# Patient Record
Sex: Female | Born: 1985
Health system: Southern US, Community
[De-identification: ages and names within clinical notes are randomized; demographics above are authoritative.]

## PROBLEM LIST (undated history)

## (undated) DIAGNOSIS — E079 Disorder of thyroid, unspecified: Secondary | ICD-10-CM

---
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
## 2005-09-11 ENCOUNTER — Emergency Department (HOSPITAL_COMMUNITY): Admission: EM | Admit: 2005-09-11 | Discharge: 2005-09-11 | Payer: Self-pay | Admitting: Emergency Medicine

## 2016-03-01 DIAGNOSIS — M9901 Segmental and somatic dysfunction of cervical region: Secondary | ICD-10-CM | POA: Diagnosis not present

## 2016-03-01 DIAGNOSIS — M545 Low back pain: Secondary | ICD-10-CM | POA: Diagnosis not present

## 2016-03-01 DIAGNOSIS — M5413 Radiculopathy, cervicothoracic region: Secondary | ICD-10-CM | POA: Diagnosis not present

## 2016-03-01 DIAGNOSIS — M9903 Segmental and somatic dysfunction of lumbar region: Secondary | ICD-10-CM | POA: Diagnosis not present

## 2016-03-08 DIAGNOSIS — M9905 Segmental and somatic dysfunction of pelvic region: Secondary | ICD-10-CM | POA: Diagnosis not present

## 2016-03-08 DIAGNOSIS — M9901 Segmental and somatic dysfunction of cervical region: Secondary | ICD-10-CM | POA: Diagnosis not present

## 2016-03-08 DIAGNOSIS — M545 Low back pain: Secondary | ICD-10-CM | POA: Diagnosis not present

## 2016-03-08 DIAGNOSIS — M9903 Segmental and somatic dysfunction of lumbar region: Secondary | ICD-10-CM | POA: Diagnosis not present

## 2016-03-15 DIAGNOSIS — M9903 Segmental and somatic dysfunction of lumbar region: Secondary | ICD-10-CM | POA: Diagnosis not present

## 2016-03-15 DIAGNOSIS — M9901 Segmental and somatic dysfunction of cervical region: Secondary | ICD-10-CM | POA: Diagnosis not present

## 2016-03-15 DIAGNOSIS — M545 Low back pain: Secondary | ICD-10-CM | POA: Diagnosis not present

## 2016-03-15 DIAGNOSIS — M9905 Segmental and somatic dysfunction of pelvic region: Secondary | ICD-10-CM | POA: Diagnosis not present

## 2016-03-22 DIAGNOSIS — M9901 Segmental and somatic dysfunction of cervical region: Secondary | ICD-10-CM | POA: Diagnosis not present

## 2016-03-22 DIAGNOSIS — M545 Low back pain: Secondary | ICD-10-CM | POA: Diagnosis not present

## 2016-03-22 DIAGNOSIS — M9903 Segmental and somatic dysfunction of lumbar region: Secondary | ICD-10-CM | POA: Diagnosis not present

## 2016-03-22 DIAGNOSIS — M9905 Segmental and somatic dysfunction of pelvic region: Secondary | ICD-10-CM | POA: Diagnosis not present

## 2016-03-29 DIAGNOSIS — M9903 Segmental and somatic dysfunction of lumbar region: Secondary | ICD-10-CM | POA: Diagnosis not present

## 2016-03-29 DIAGNOSIS — M545 Low back pain: Secondary | ICD-10-CM | POA: Diagnosis not present

## 2016-03-29 DIAGNOSIS — M9905 Segmental and somatic dysfunction of pelvic region: Secondary | ICD-10-CM | POA: Diagnosis not present

## 2016-03-29 DIAGNOSIS — M9901 Segmental and somatic dysfunction of cervical region: Secondary | ICD-10-CM | POA: Diagnosis not present

## 2016-04-05 DIAGNOSIS — M545 Low back pain: Secondary | ICD-10-CM | POA: Diagnosis not present

## 2016-04-05 DIAGNOSIS — M9901 Segmental and somatic dysfunction of cervical region: Secondary | ICD-10-CM | POA: Diagnosis not present

## 2016-04-05 DIAGNOSIS — M9905 Segmental and somatic dysfunction of pelvic region: Secondary | ICD-10-CM | POA: Diagnosis not present

## 2016-04-05 DIAGNOSIS — M9903 Segmental and somatic dysfunction of lumbar region: Secondary | ICD-10-CM | POA: Diagnosis not present

## 2016-04-19 DIAGNOSIS — M9901 Segmental and somatic dysfunction of cervical region: Secondary | ICD-10-CM | POA: Diagnosis not present

## 2016-04-19 DIAGNOSIS — M9903 Segmental and somatic dysfunction of lumbar region: Secondary | ICD-10-CM | POA: Diagnosis not present

## 2016-04-19 DIAGNOSIS — M545 Low back pain: Secondary | ICD-10-CM | POA: Diagnosis not present

## 2016-04-19 DIAGNOSIS — M9905 Segmental and somatic dysfunction of pelvic region: Secondary | ICD-10-CM | POA: Diagnosis not present

## 2016-05-28 DIAGNOSIS — H66003 Acute suppurative otitis media without spontaneous rupture of ear drum, bilateral: Secondary | ICD-10-CM | POA: Diagnosis not present

## 2016-07-12 DIAGNOSIS — M9903 Segmental and somatic dysfunction of lumbar region: Secondary | ICD-10-CM | POA: Diagnosis not present

## 2016-07-12 DIAGNOSIS — M545 Low back pain: Secondary | ICD-10-CM | POA: Diagnosis not present

## 2016-07-12 DIAGNOSIS — M9901 Segmental and somatic dysfunction of cervical region: Secondary | ICD-10-CM | POA: Diagnosis not present

## 2016-07-12 DIAGNOSIS — M9905 Segmental and somatic dysfunction of pelvic region: Secondary | ICD-10-CM | POA: Diagnosis not present

## 2016-08-29 DIAGNOSIS — Z793 Long term (current) use of hormonal contraceptives: Secondary | ICD-10-CM | POA: Diagnosis not present

## 2016-08-29 DIAGNOSIS — Z130 Encounter for screening for diseases of the blood and blood-forming organs and certain disorders involving the immune mechanism: Secondary | ICD-10-CM | POA: Diagnosis not present

## 2016-08-29 DIAGNOSIS — Z01419 Encounter for gynecological examination (general) (routine) without abnormal findings: Secondary | ICD-10-CM | POA: Diagnosis not present

## 2016-08-29 DIAGNOSIS — Z1389 Encounter for screening for other disorder: Secondary | ICD-10-CM | POA: Diagnosis not present

## 2016-08-29 DIAGNOSIS — Z6831 Body mass index (BMI) 31.0-31.9, adult: Secondary | ICD-10-CM | POA: Diagnosis not present

## 2016-10-04 DIAGNOSIS — M9903 Segmental and somatic dysfunction of lumbar region: Secondary | ICD-10-CM | POA: Diagnosis not present

## 2016-10-04 DIAGNOSIS — M546 Pain in thoracic spine: Secondary | ICD-10-CM | POA: Diagnosis not present

## 2016-10-04 DIAGNOSIS — M545 Low back pain: Secondary | ICD-10-CM | POA: Diagnosis not present

## 2016-10-04 DIAGNOSIS — M9902 Segmental and somatic dysfunction of thoracic region: Secondary | ICD-10-CM | POA: Diagnosis not present

## 2016-12-20 DIAGNOSIS — M5413 Radiculopathy, cervicothoracic region: Secondary | ICD-10-CM | POA: Diagnosis not present

## 2016-12-20 DIAGNOSIS — M9903 Segmental and somatic dysfunction of lumbar region: Secondary | ICD-10-CM | POA: Diagnosis not present

## 2016-12-20 DIAGNOSIS — M545 Low back pain: Secondary | ICD-10-CM | POA: Diagnosis not present

## 2016-12-20 DIAGNOSIS — M9901 Segmental and somatic dysfunction of cervical region: Secondary | ICD-10-CM | POA: Diagnosis not present

## 2017-01-10 DIAGNOSIS — M9905 Segmental and somatic dysfunction of pelvic region: Secondary | ICD-10-CM | POA: Diagnosis not present

## 2017-01-10 DIAGNOSIS — M545 Low back pain: Secondary | ICD-10-CM | POA: Diagnosis not present

## 2017-01-10 DIAGNOSIS — M9903 Segmental and somatic dysfunction of lumbar region: Secondary | ICD-10-CM | POA: Diagnosis not present

## 2017-01-10 DIAGNOSIS — M9901 Segmental and somatic dysfunction of cervical region: Secondary | ICD-10-CM | POA: Diagnosis not present

## 2017-03-14 DIAGNOSIS — M9901 Segmental and somatic dysfunction of cervical region: Secondary | ICD-10-CM | POA: Diagnosis not present

## 2017-03-14 DIAGNOSIS — M545 Low back pain: Secondary | ICD-10-CM | POA: Diagnosis not present

## 2017-03-14 DIAGNOSIS — M9905 Segmental and somatic dysfunction of pelvic region: Secondary | ICD-10-CM | POA: Diagnosis not present

## 2017-03-14 DIAGNOSIS — M9903 Segmental and somatic dysfunction of lumbar region: Secondary | ICD-10-CM | POA: Diagnosis not present

## 2017-05-28 DIAGNOSIS — M9902 Segmental and somatic dysfunction of thoracic region: Secondary | ICD-10-CM | POA: Diagnosis not present

## 2017-05-28 DIAGNOSIS — M9905 Segmental and somatic dysfunction of pelvic region: Secondary | ICD-10-CM | POA: Diagnosis not present

## 2017-05-28 DIAGNOSIS — M545 Low back pain: Secondary | ICD-10-CM | POA: Diagnosis not present

## 2017-05-28 DIAGNOSIS — M9903 Segmental and somatic dysfunction of lumbar region: Secondary | ICD-10-CM | POA: Diagnosis not present

## 2017-07-25 DIAGNOSIS — M546 Pain in thoracic spine: Secondary | ICD-10-CM | POA: Diagnosis not present

## 2017-07-25 DIAGNOSIS — M9903 Segmental and somatic dysfunction of lumbar region: Secondary | ICD-10-CM | POA: Diagnosis not present

## 2017-07-25 DIAGNOSIS — M9902 Segmental and somatic dysfunction of thoracic region: Secondary | ICD-10-CM | POA: Diagnosis not present

## 2017-07-25 DIAGNOSIS — M545 Low back pain: Secondary | ICD-10-CM | POA: Diagnosis not present

## 2017-09-27 DIAGNOSIS — M9903 Segmental and somatic dysfunction of lumbar region: Secondary | ICD-10-CM | POA: Diagnosis not present

## 2017-09-27 DIAGNOSIS — M545 Low back pain: Secondary | ICD-10-CM | POA: Diagnosis not present

## 2017-09-27 DIAGNOSIS — M9902 Segmental and somatic dysfunction of thoracic region: Secondary | ICD-10-CM | POA: Diagnosis not present

## 2017-09-27 DIAGNOSIS — M546 Pain in thoracic spine: Secondary | ICD-10-CM | POA: Diagnosis not present

## 2017-10-31 DIAGNOSIS — Z01419 Encounter for gynecological examination (general) (routine) without abnormal findings: Secondary | ICD-10-CM | POA: Diagnosis not present

## 2017-10-31 DIAGNOSIS — Z6831 Body mass index (BMI) 31.0-31.9, adult: Secondary | ICD-10-CM | POA: Diagnosis not present

## 2017-10-31 DIAGNOSIS — Z1389 Encounter for screening for other disorder: Secondary | ICD-10-CM | POA: Diagnosis not present

## 2017-10-31 DIAGNOSIS — Z124 Encounter for screening for malignant neoplasm of cervix: Secondary | ICD-10-CM | POA: Diagnosis not present

## 2017-10-31 DIAGNOSIS — Z130 Encounter for screening for diseases of the blood and blood-forming organs and certain disorders involving the immune mechanism: Secondary | ICD-10-CM | POA: Diagnosis not present

## 2017-11-01 DIAGNOSIS — Z124 Encounter for screening for malignant neoplasm of cervix: Secondary | ICD-10-CM | POA: Diagnosis not present

## 2017-11-14 DIAGNOSIS — M9902 Segmental and somatic dysfunction of thoracic region: Secondary | ICD-10-CM | POA: Diagnosis not present

## 2017-11-14 DIAGNOSIS — M545 Low back pain: Secondary | ICD-10-CM | POA: Diagnosis not present

## 2017-11-14 DIAGNOSIS — M9903 Segmental and somatic dysfunction of lumbar region: Secondary | ICD-10-CM | POA: Diagnosis not present

## 2017-11-14 DIAGNOSIS — M546 Pain in thoracic spine: Secondary | ICD-10-CM | POA: Diagnosis not present

## 2017-12-01 DIAGNOSIS — R300 Dysuria: Secondary | ICD-10-CM | POA: Diagnosis not present

## 2017-12-01 DIAGNOSIS — Z6831 Body mass index (BMI) 31.0-31.9, adult: Secondary | ICD-10-CM | POA: Diagnosis not present

## 2017-12-01 DIAGNOSIS — N390 Urinary tract infection, site not specified: Secondary | ICD-10-CM | POA: Diagnosis not present

## 2018-01-23 DIAGNOSIS — M9902 Segmental and somatic dysfunction of thoracic region: Secondary | ICD-10-CM | POA: Diagnosis not present

## 2018-01-23 DIAGNOSIS — M545 Low back pain: Secondary | ICD-10-CM | POA: Diagnosis not present

## 2018-01-23 DIAGNOSIS — M546 Pain in thoracic spine: Secondary | ICD-10-CM | POA: Diagnosis not present

## 2018-01-23 DIAGNOSIS — M9903 Segmental and somatic dysfunction of lumbar region: Secondary | ICD-10-CM | POA: Diagnosis not present

## 2018-02-28 DIAGNOSIS — M546 Pain in thoracic spine: Secondary | ICD-10-CM | POA: Diagnosis not present

## 2018-02-28 DIAGNOSIS — M9902 Segmental and somatic dysfunction of thoracic region: Secondary | ICD-10-CM | POA: Diagnosis not present

## 2018-02-28 DIAGNOSIS — M545 Low back pain: Secondary | ICD-10-CM | POA: Diagnosis not present

## 2018-02-28 DIAGNOSIS — M9903 Segmental and somatic dysfunction of lumbar region: Secondary | ICD-10-CM | POA: Diagnosis not present

## 2018-04-19 DIAGNOSIS — M9903 Segmental and somatic dysfunction of lumbar region: Secondary | ICD-10-CM | POA: Diagnosis not present

## 2018-04-19 DIAGNOSIS — M546 Pain in thoracic spine: Secondary | ICD-10-CM | POA: Diagnosis not present

## 2018-04-19 DIAGNOSIS — M9902 Segmental and somatic dysfunction of thoracic region: Secondary | ICD-10-CM | POA: Diagnosis not present

## 2018-05-15 DIAGNOSIS — M9902 Segmental and somatic dysfunction of thoracic region: Secondary | ICD-10-CM | POA: Diagnosis not present

## 2018-05-15 DIAGNOSIS — M546 Pain in thoracic spine: Secondary | ICD-10-CM | POA: Diagnosis not present

## 2018-05-15 DIAGNOSIS — M545 Low back pain: Secondary | ICD-10-CM | POA: Diagnosis not present

## 2018-05-15 DIAGNOSIS — M9903 Segmental and somatic dysfunction of lumbar region: Secondary | ICD-10-CM | POA: Diagnosis not present

## 2018-05-17 DIAGNOSIS — E669 Obesity, unspecified: Secondary | ICD-10-CM | POA: Diagnosis not present

## 2018-05-17 DIAGNOSIS — Z23 Encounter for immunization: Secondary | ICD-10-CM | POA: Diagnosis not present

## 2018-05-17 DIAGNOSIS — R030 Elevated blood-pressure reading, without diagnosis of hypertension: Secondary | ICD-10-CM | POA: Diagnosis not present

## 2018-05-17 DIAGNOSIS — H1031 Unspecified acute conjunctivitis, right eye: Secondary | ICD-10-CM | POA: Diagnosis not present

## 2018-05-20 DIAGNOSIS — B308 Other viral conjunctivitis: Secondary | ICD-10-CM | POA: Diagnosis not present

## 2018-05-22 DIAGNOSIS — J019 Acute sinusitis, unspecified: Secondary | ICD-10-CM | POA: Diagnosis not present

## 2018-05-27 DIAGNOSIS — B308 Other viral conjunctivitis: Secondary | ICD-10-CM | POA: Diagnosis not present

## 2018-07-23 DIAGNOSIS — Z0000 Encounter for general adult medical examination without abnormal findings: Secondary | ICD-10-CM | POA: Diagnosis not present

## 2018-07-23 DIAGNOSIS — R946 Abnormal results of thyroid function studies: Secondary | ICD-10-CM | POA: Diagnosis not present

## 2018-07-23 DIAGNOSIS — Z1329 Encounter for screening for other suspected endocrine disorder: Secondary | ICD-10-CM | POA: Diagnosis not present

## 2018-07-23 DIAGNOSIS — Z6831 Body mass index (BMI) 31.0-31.9, adult: Secondary | ICD-10-CM | POA: Diagnosis not present

## 2018-07-25 DIAGNOSIS — M545 Low back pain: Secondary | ICD-10-CM | POA: Diagnosis not present

## 2018-07-25 DIAGNOSIS — M546 Pain in thoracic spine: Secondary | ICD-10-CM | POA: Diagnosis not present

## 2018-07-25 DIAGNOSIS — M9903 Segmental and somatic dysfunction of lumbar region: Secondary | ICD-10-CM | POA: Diagnosis not present

## 2018-07-25 DIAGNOSIS — M9902 Segmental and somatic dysfunction of thoracic region: Secondary | ICD-10-CM | POA: Diagnosis not present

## 2018-07-26 ENCOUNTER — Other Ambulatory Visit (HOSPITAL_COMMUNITY): Payer: Self-pay | Admitting: Internal Medicine

## 2018-07-26 DIAGNOSIS — R946 Abnormal results of thyroid function studies: Secondary | ICD-10-CM | POA: Diagnosis not present

## 2018-07-26 DIAGNOSIS — E6609 Other obesity due to excess calories: Secondary | ICD-10-CM | POA: Diagnosis not present

## 2018-07-26 DIAGNOSIS — Z0000 Encounter for general adult medical examination without abnormal findings: Secondary | ICD-10-CM | POA: Diagnosis not present

## 2018-07-31 ENCOUNTER — Ambulatory Visit (HOSPITAL_COMMUNITY)
Admission: RE | Admit: 2018-07-31 | Discharge: 2018-07-31 | Disposition: A | Discharge status: Home / Self Care | Payer: BLUE CROSS/BLUE SHIELD | Source: Ambulatory Visit | Attending: Internal Medicine | Admitting: Internal Medicine

## 2018-07-31 DIAGNOSIS — R946 Abnormal results of thyroid function studies: Secondary | ICD-10-CM | POA: Insufficient documentation

## 2018-08-07 DIAGNOSIS — R21 Rash and other nonspecific skin eruption: Secondary | ICD-10-CM | POA: Diagnosis not present

## 2018-08-23 DIAGNOSIS — J019 Acute sinusitis, unspecified: Secondary | ICD-10-CM | POA: Diagnosis not present

## 2018-08-23 DIAGNOSIS — E6609 Other obesity due to excess calories: Secondary | ICD-10-CM | POA: Diagnosis not present

## 2018-08-23 DIAGNOSIS — Z6831 Body mass index (BMI) 31.0-31.9, adult: Secondary | ICD-10-CM | POA: Diagnosis not present

## 2018-08-23 DIAGNOSIS — R300 Dysuria: Secondary | ICD-10-CM | POA: Diagnosis not present

## 2018-08-23 DIAGNOSIS — R946 Abnormal results of thyroid function studies: Secondary | ICD-10-CM | POA: Diagnosis not present

## 2018-08-23 DIAGNOSIS — N390 Urinary tract infection, site not specified: Secondary | ICD-10-CM | POA: Diagnosis not present

## 2018-10-11 DIAGNOSIS — M9903 Segmental and somatic dysfunction of lumbar region: Secondary | ICD-10-CM | POA: Diagnosis not present

## 2018-10-11 DIAGNOSIS — M9902 Segmental and somatic dysfunction of thoracic region: Secondary | ICD-10-CM | POA: Diagnosis not present

## 2018-10-11 DIAGNOSIS — M546 Pain in thoracic spine: Secondary | ICD-10-CM | POA: Diagnosis not present

## 2018-10-11 DIAGNOSIS — M545 Low back pain: Secondary | ICD-10-CM | POA: Diagnosis not present

## 2018-10-31 DIAGNOSIS — Z01419 Encounter for gynecological examination (general) (routine) without abnormal findings: Secondary | ICD-10-CM | POA: Diagnosis not present

## 2018-10-31 DIAGNOSIS — Z1389 Encounter for screening for other disorder: Secondary | ICD-10-CM | POA: Diagnosis not present

## 2018-10-31 DIAGNOSIS — Z6834 Body mass index (BMI) 34.0-34.9, adult: Secondary | ICD-10-CM | POA: Diagnosis not present

## 2018-10-31 DIAGNOSIS — Z130 Encounter for screening for diseases of the blood and blood-forming organs and certain disorders involving the immune mechanism: Secondary | ICD-10-CM | POA: Diagnosis not present

## 2018-11-22 DIAGNOSIS — M9903 Segmental and somatic dysfunction of lumbar region: Secondary | ICD-10-CM | POA: Diagnosis not present

## 2018-11-22 DIAGNOSIS — M546 Pain in thoracic spine: Secondary | ICD-10-CM | POA: Diagnosis not present

## 2018-11-22 DIAGNOSIS — M545 Low back pain: Secondary | ICD-10-CM | POA: Diagnosis not present

## 2018-11-22 DIAGNOSIS — M9902 Segmental and somatic dysfunction of thoracic region: Secondary | ICD-10-CM | POA: Diagnosis not present

## 2019-02-26 DIAGNOSIS — M9902 Segmental and somatic dysfunction of thoracic region: Secondary | ICD-10-CM | POA: Diagnosis not present

## 2019-02-26 DIAGNOSIS — M9903 Segmental and somatic dysfunction of lumbar region: Secondary | ICD-10-CM | POA: Diagnosis not present

## 2019-02-26 DIAGNOSIS — M545 Low back pain: Secondary | ICD-10-CM | POA: Diagnosis not present

## 2019-02-26 DIAGNOSIS — M546 Pain in thoracic spine: Secondary | ICD-10-CM | POA: Diagnosis not present

## 2019-03-05 DIAGNOSIS — M9902 Segmental and somatic dysfunction of thoracic region: Secondary | ICD-10-CM | POA: Diagnosis not present

## 2019-03-05 DIAGNOSIS — M545 Low back pain: Secondary | ICD-10-CM | POA: Diagnosis not present

## 2019-03-05 DIAGNOSIS — M546 Pain in thoracic spine: Secondary | ICD-10-CM | POA: Diagnosis not present

## 2019-03-05 DIAGNOSIS — M9903 Segmental and somatic dysfunction of lumbar region: Secondary | ICD-10-CM | POA: Diagnosis not present

## 2019-03-07 DIAGNOSIS — M9903 Segmental and somatic dysfunction of lumbar region: Secondary | ICD-10-CM | POA: Diagnosis not present

## 2019-03-07 DIAGNOSIS — M546 Pain in thoracic spine: Secondary | ICD-10-CM | POA: Diagnosis not present

## 2019-03-07 DIAGNOSIS — M9902 Segmental and somatic dysfunction of thoracic region: Secondary | ICD-10-CM | POA: Diagnosis not present

## 2019-03-07 DIAGNOSIS — M545 Low back pain: Secondary | ICD-10-CM | POA: Diagnosis not present

## 2019-03-14 DIAGNOSIS — M9902 Segmental and somatic dysfunction of thoracic region: Secondary | ICD-10-CM | POA: Diagnosis not present

## 2019-03-14 DIAGNOSIS — M546 Pain in thoracic spine: Secondary | ICD-10-CM | POA: Diagnosis not present

## 2019-03-14 DIAGNOSIS — M545 Low back pain: Secondary | ICD-10-CM | POA: Diagnosis not present

## 2019-03-14 DIAGNOSIS — M9903 Segmental and somatic dysfunction of lumbar region: Secondary | ICD-10-CM | POA: Diagnosis not present

## 2019-03-17 DIAGNOSIS — R300 Dysuria: Secondary | ICD-10-CM | POA: Diagnosis not present

## 2019-03-17 DIAGNOSIS — R946 Abnormal results of thyroid function studies: Secondary | ICD-10-CM | POA: Diagnosis not present

## 2019-03-17 DIAGNOSIS — E039 Hypothyroidism, unspecified: Secondary | ICD-10-CM | POA: Diagnosis not present

## 2019-03-17 DIAGNOSIS — N390 Urinary tract infection, site not specified: Secondary | ICD-10-CM | POA: Diagnosis not present

## 2019-03-17 DIAGNOSIS — Z0000 Encounter for general adult medical examination without abnormal findings: Secondary | ICD-10-CM | POA: Diagnosis not present

## 2019-03-17 DIAGNOSIS — E6609 Other obesity due to excess calories: Secondary | ICD-10-CM | POA: Diagnosis not present

## 2019-04-09 DIAGNOSIS — R946 Abnormal results of thyroid function studies: Secondary | ICD-10-CM | POA: Diagnosis not present

## 2019-04-09 DIAGNOSIS — E039 Hypothyroidism, unspecified: Secondary | ICD-10-CM | POA: Diagnosis not present

## 2019-04-16 DIAGNOSIS — E039 Hypothyroidism, unspecified: Secondary | ICD-10-CM | POA: Diagnosis not present

## 2019-04-16 DIAGNOSIS — E6609 Other obesity due to excess calories: Secondary | ICD-10-CM | POA: Diagnosis not present

## 2019-04-16 DIAGNOSIS — Z0000 Encounter for general adult medical examination without abnormal findings: Secondary | ICD-10-CM | POA: Diagnosis not present

## 2019-04-16 DIAGNOSIS — R946 Abnormal results of thyroid function studies: Secondary | ICD-10-CM | POA: Diagnosis not present

## 2019-04-23 DIAGNOSIS — M9903 Segmental and somatic dysfunction of lumbar region: Secondary | ICD-10-CM | POA: Diagnosis not present

## 2019-04-23 DIAGNOSIS — M545 Low back pain: Secondary | ICD-10-CM | POA: Diagnosis not present

## 2019-04-23 DIAGNOSIS — M546 Pain in thoracic spine: Secondary | ICD-10-CM | POA: Diagnosis not present

## 2019-04-23 DIAGNOSIS — M9902 Segmental and somatic dysfunction of thoracic region: Secondary | ICD-10-CM | POA: Diagnosis not present

## 2019-05-02 DIAGNOSIS — Z1159 Encounter for screening for other viral diseases: Secondary | ICD-10-CM | POA: Diagnosis not present

## 2019-05-26 DIAGNOSIS — M546 Pain in thoracic spine: Secondary | ICD-10-CM | POA: Diagnosis not present

## 2019-05-26 DIAGNOSIS — M9902 Segmental and somatic dysfunction of thoracic region: Secondary | ICD-10-CM | POA: Diagnosis not present

## 2019-05-26 DIAGNOSIS — M545 Low back pain: Secondary | ICD-10-CM | POA: Diagnosis not present

## 2019-05-26 DIAGNOSIS — M9903 Segmental and somatic dysfunction of lumbar region: Secondary | ICD-10-CM | POA: Diagnosis not present

## 2019-06-25 DIAGNOSIS — M545 Low back pain: Secondary | ICD-10-CM | POA: Diagnosis not present

## 2019-06-25 DIAGNOSIS — M25552 Pain in left hip: Secondary | ICD-10-CM | POA: Diagnosis not present

## 2019-07-04 DIAGNOSIS — M5413 Radiculopathy, cervicothoracic region: Secondary | ICD-10-CM | POA: Diagnosis not present

## 2019-07-04 DIAGNOSIS — M9901 Segmental and somatic dysfunction of cervical region: Secondary | ICD-10-CM | POA: Diagnosis not present

## 2019-07-04 DIAGNOSIS — M546 Pain in thoracic spine: Secondary | ICD-10-CM | POA: Diagnosis not present

## 2019-07-04 DIAGNOSIS — M9902 Segmental and somatic dysfunction of thoracic region: Secondary | ICD-10-CM | POA: Diagnosis not present

## 2019-07-21 DIAGNOSIS — E039 Hypothyroidism, unspecified: Secondary | ICD-10-CM | POA: Diagnosis not present

## 2019-07-21 DIAGNOSIS — R946 Abnormal results of thyroid function studies: Secondary | ICD-10-CM | POA: Diagnosis not present

## 2019-07-24 DIAGNOSIS — E039 Hypothyroidism, unspecified: Secondary | ICD-10-CM | POA: Diagnosis not present

## 2019-07-24 DIAGNOSIS — E6609 Other obesity due to excess calories: Secondary | ICD-10-CM | POA: Diagnosis not present

## 2019-07-28 DIAGNOSIS — M546 Pain in thoracic spine: Secondary | ICD-10-CM | POA: Diagnosis not present

## 2019-07-28 DIAGNOSIS — M9903 Segmental and somatic dysfunction of lumbar region: Secondary | ICD-10-CM | POA: Diagnosis not present

## 2019-07-28 DIAGNOSIS — M9901 Segmental and somatic dysfunction of cervical region: Secondary | ICD-10-CM | POA: Diagnosis not present

## 2019-07-28 DIAGNOSIS — M9902 Segmental and somatic dysfunction of thoracic region: Secondary | ICD-10-CM | POA: Diagnosis not present

## 2019-08-08 DIAGNOSIS — M9901 Segmental and somatic dysfunction of cervical region: Secondary | ICD-10-CM | POA: Diagnosis not present

## 2019-08-08 DIAGNOSIS — M545 Low back pain: Secondary | ICD-10-CM | POA: Diagnosis not present

## 2019-08-08 DIAGNOSIS — M9903 Segmental and somatic dysfunction of lumbar region: Secondary | ICD-10-CM | POA: Diagnosis not present

## 2019-08-08 DIAGNOSIS — M5413 Radiculopathy, cervicothoracic region: Secondary | ICD-10-CM | POA: Diagnosis not present

## 2019-09-02 DIAGNOSIS — M25552 Pain in left hip: Secondary | ICD-10-CM | POA: Diagnosis not present

## 2019-09-05 DIAGNOSIS — M9901 Segmental and somatic dysfunction of cervical region: Secondary | ICD-10-CM | POA: Diagnosis not present

## 2019-09-05 DIAGNOSIS — M545 Low back pain: Secondary | ICD-10-CM | POA: Diagnosis not present

## 2019-09-05 DIAGNOSIS — M9902 Segmental and somatic dysfunction of thoracic region: Secondary | ICD-10-CM | POA: Diagnosis not present

## 2019-09-05 DIAGNOSIS — M9903 Segmental and somatic dysfunction of lumbar region: Secondary | ICD-10-CM | POA: Diagnosis not present

## 2019-09-17 DIAGNOSIS — M25552 Pain in left hip: Secondary | ICD-10-CM | POA: Diagnosis not present

## 2019-10-01 DIAGNOSIS — M545 Low back pain: Secondary | ICD-10-CM | POA: Diagnosis not present

## 2019-10-01 DIAGNOSIS — M9902 Segmental and somatic dysfunction of thoracic region: Secondary | ICD-10-CM | POA: Diagnosis not present

## 2019-10-01 DIAGNOSIS — M47816 Spondylosis without myelopathy or radiculopathy, lumbar region: Secondary | ICD-10-CM | POA: Diagnosis not present

## 2019-10-01 DIAGNOSIS — M546 Pain in thoracic spine: Secondary | ICD-10-CM | POA: Diagnosis not present

## 2019-10-01 DIAGNOSIS — M9903 Segmental and somatic dysfunction of lumbar region: Secondary | ICD-10-CM | POA: Diagnosis not present

## 2019-10-01 IMAGING — US US THYROID
1 series · 14 of 25 positions shown · non-contrast
Comparison: None.

CLINICAL DATA: Other.  Abnormal thyroid function tests.

EXAM:
THYROID ULTRASOUND
TECHNIQUE: Ultrasound examination of the thyroid gland and adjacent soft
tissues was performed.

[Series 1: us thyroid · 0.06mm/px · 14 of 53 slices shown]
[im 1/53]
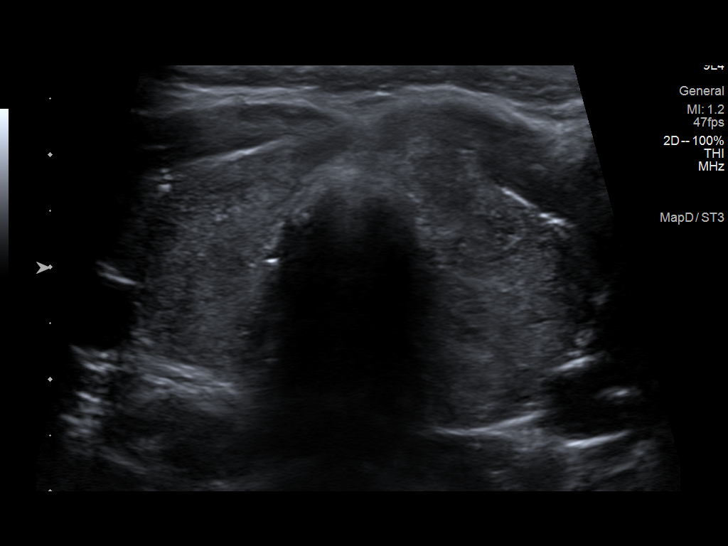
[im 5/53]
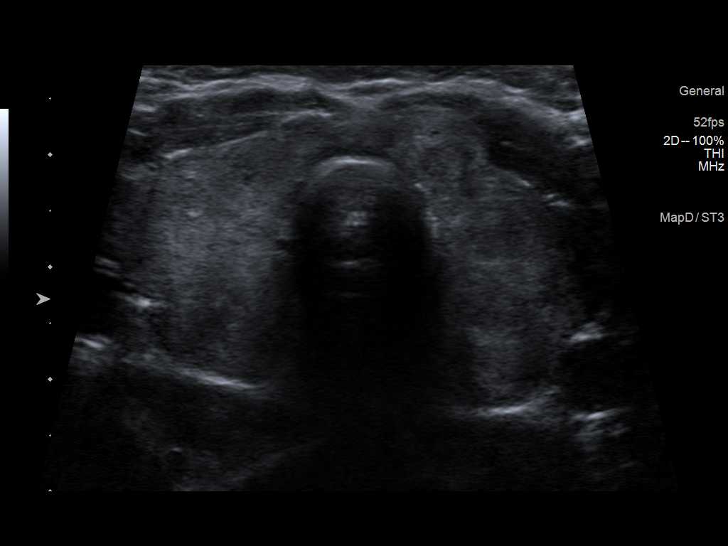
[im 9/53]
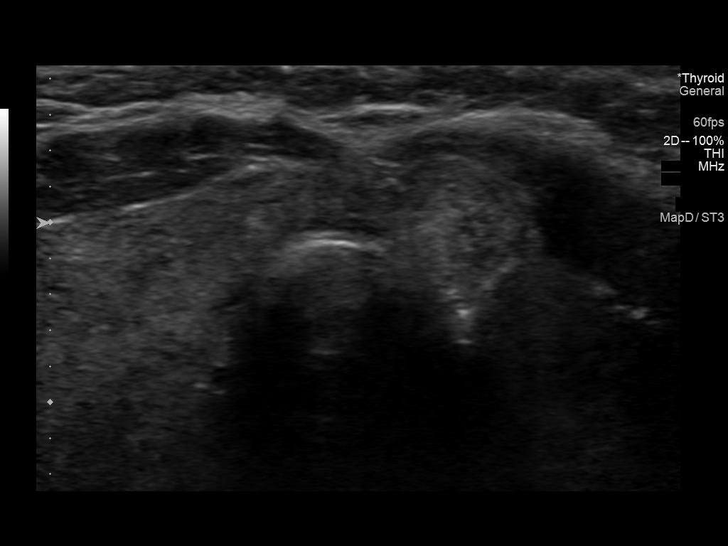
[im 14/53]
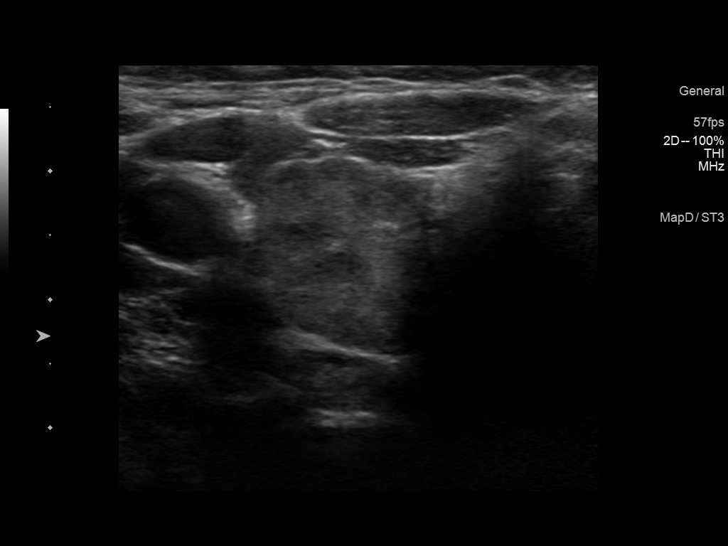
[im 18/53]
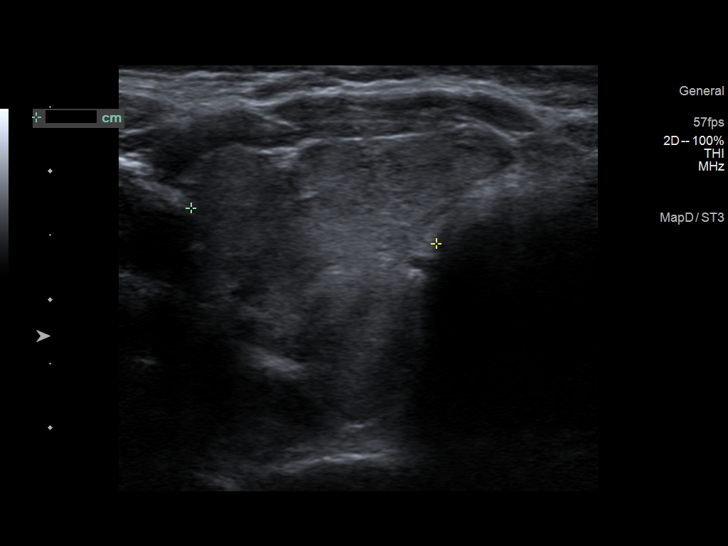
[im 20/53]
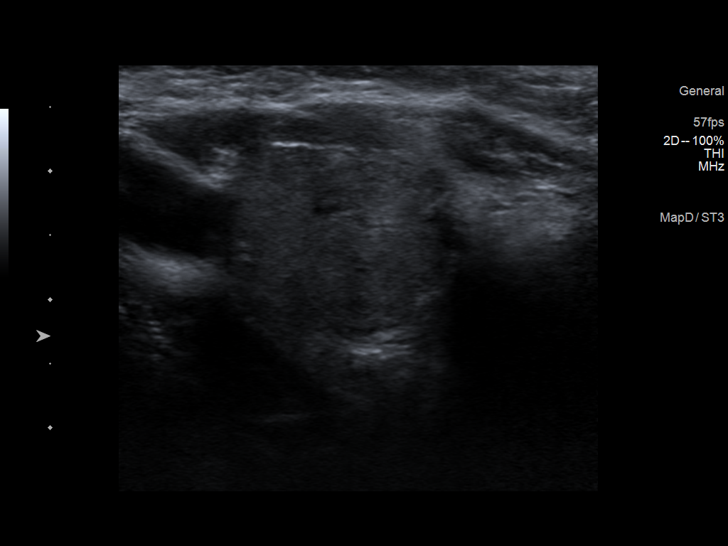
[im 24/53]
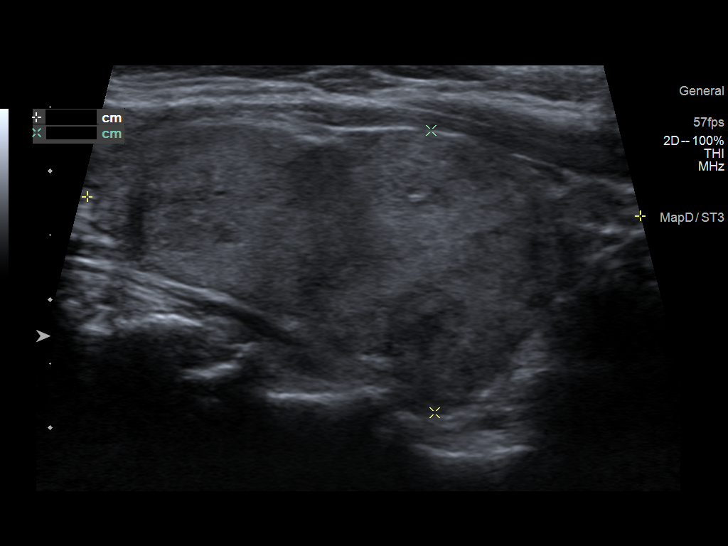
[im 29/53]
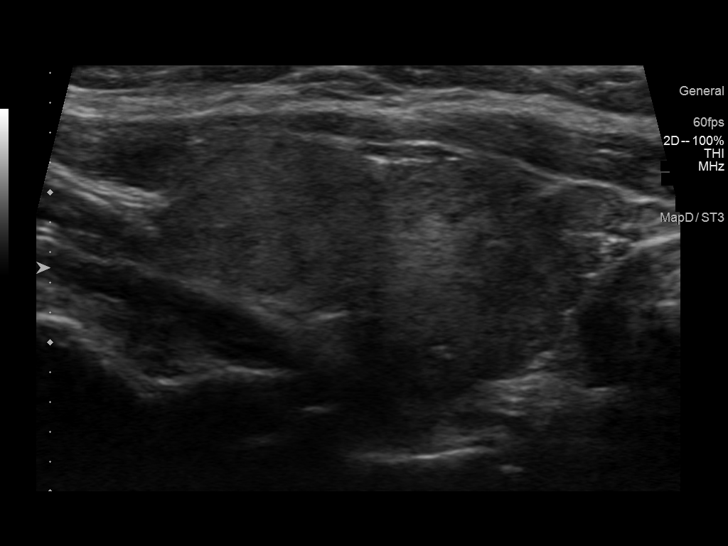
[im 33/53]
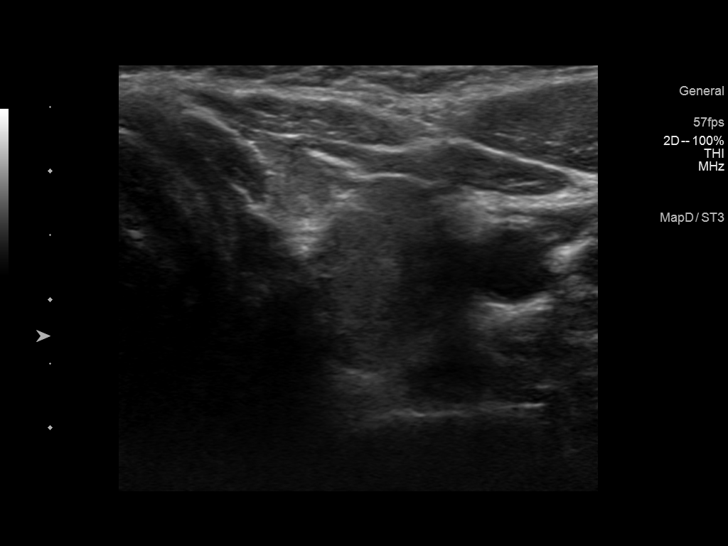
[im 35/53]
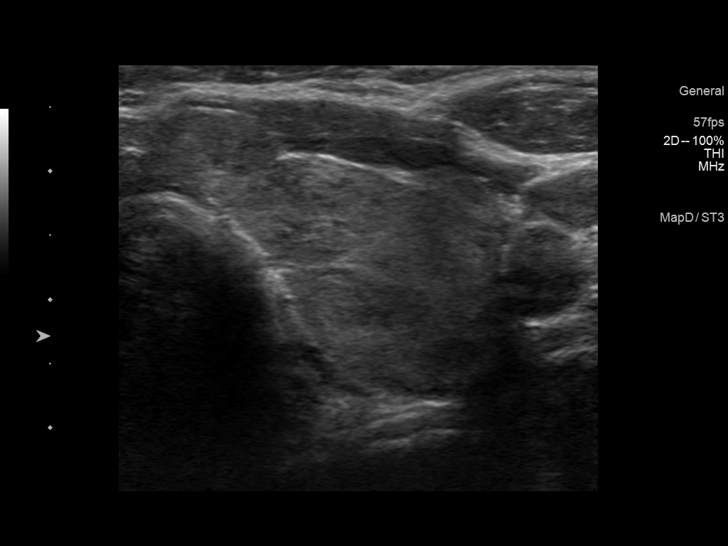
[im 40/53]
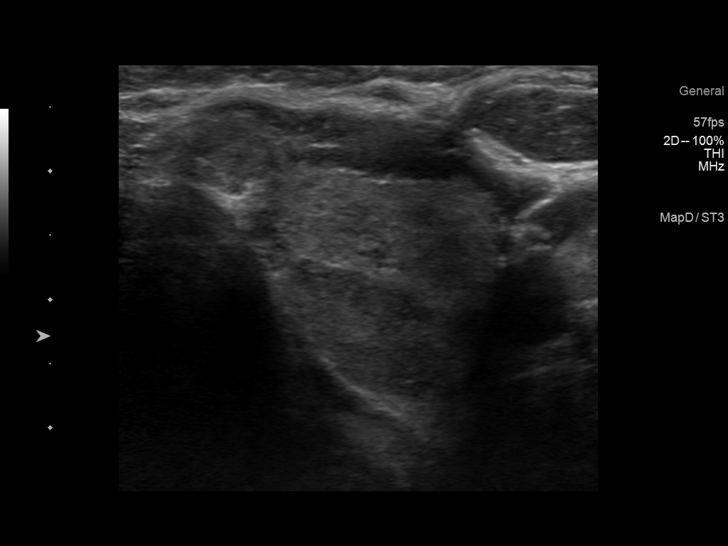
[im 44/53]
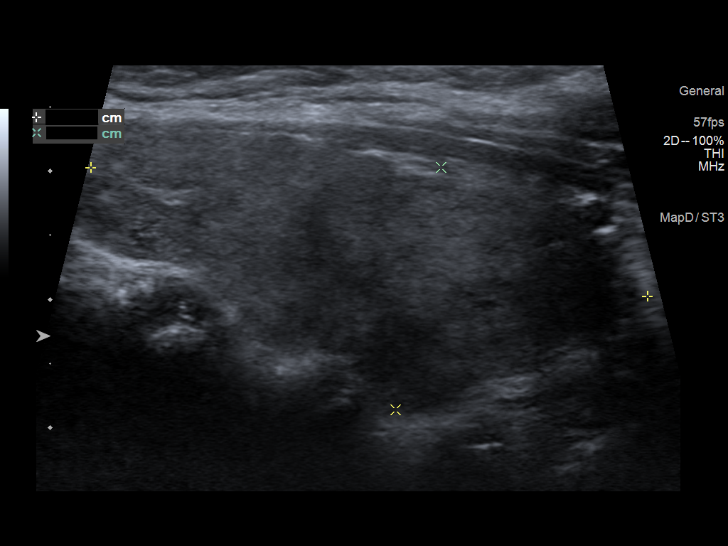
[im 48/53]
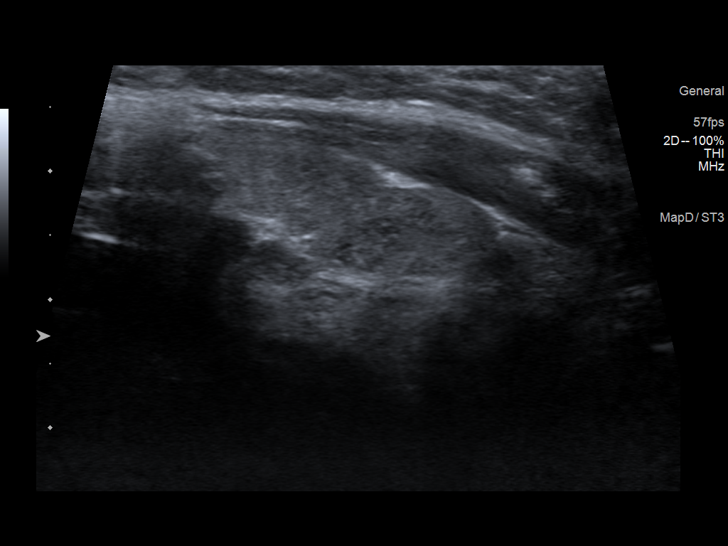
[im 53/53]
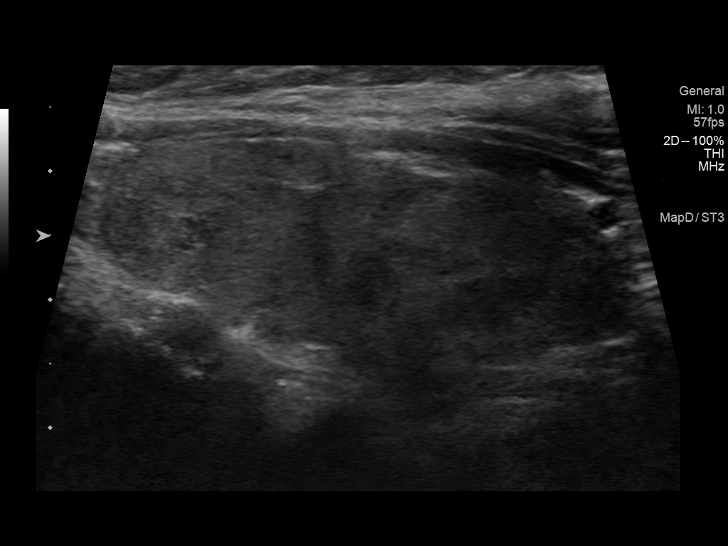

[14 of 25 positions shown; findings below may reference images not displayed]

FINDINGS: Parenchymal Echotexture: Moderately heterogenous - mild lobularity
of the border of the thyroid gland. No definitive glandular
hyperemia.

Isthmus: Normal in size measures 0.3 cm in diameter

Right lobe: Normal in size measuring 4.3 x 2.2 x 1.9 cm

Left lobe: Meters normal in size measuring 4.5 x 1.9 x 1.9 cm

_________________________________________________________

Estimated total number of nodules >/= 1 cm: 0

Number of spongiform nodules >/=  2 cm not described below (TR1): 0

Number of mixed cystic and solid nodules >/= 1.5 cm not described
below (TR2): 0

_________________________________________________________

No discrete nodules are seen within the thyroid gland.
IMPRESSION: Moderately heterogeneous though normal sized thyroid gland without
discrete nodule or mass. Findings are nonspecific though could be
seen in the setting of a thyroiditis. Clinical correlation is
advised.

## 2019-10-14 DIAGNOSIS — M545 Low back pain: Secondary | ICD-10-CM | POA: Diagnosis not present

## 2019-10-17 DIAGNOSIS — M545 Low back pain: Secondary | ICD-10-CM | POA: Diagnosis not present

## 2019-10-17 DIAGNOSIS — M9902 Segmental and somatic dysfunction of thoracic region: Secondary | ICD-10-CM | POA: Diagnosis not present

## 2019-10-17 DIAGNOSIS — M546 Pain in thoracic spine: Secondary | ICD-10-CM | POA: Diagnosis not present

## 2019-10-17 DIAGNOSIS — M9903 Segmental and somatic dysfunction of lumbar region: Secondary | ICD-10-CM | POA: Diagnosis not present

## 2019-10-23 DIAGNOSIS — M25552 Pain in left hip: Secondary | ICD-10-CM | POA: Diagnosis not present

## 2019-10-23 DIAGNOSIS — M545 Low back pain: Secondary | ICD-10-CM | POA: Diagnosis not present

## 2019-10-23 DIAGNOSIS — M25652 Stiffness of left hip, not elsewhere classified: Secondary | ICD-10-CM | POA: Diagnosis not present

## 2019-10-23 DIAGNOSIS — R531 Weakness: Secondary | ICD-10-CM | POA: Diagnosis not present

## 2019-10-27 DIAGNOSIS — R531 Weakness: Secondary | ICD-10-CM | POA: Diagnosis not present

## 2019-10-27 DIAGNOSIS — M25652 Stiffness of left hip, not elsewhere classified: Secondary | ICD-10-CM | POA: Diagnosis not present

## 2019-10-27 DIAGNOSIS — M545 Low back pain: Secondary | ICD-10-CM | POA: Diagnosis not present

## 2019-10-27 DIAGNOSIS — M25552 Pain in left hip: Secondary | ICD-10-CM | POA: Diagnosis not present

## 2019-10-28 DIAGNOSIS — M25552 Pain in left hip: Secondary | ICD-10-CM | POA: Diagnosis not present

## 2019-10-28 DIAGNOSIS — M545 Low back pain: Secondary | ICD-10-CM | POA: Diagnosis not present

## 2019-10-28 DIAGNOSIS — M25652 Stiffness of left hip, not elsewhere classified: Secondary | ICD-10-CM | POA: Diagnosis not present

## 2019-10-28 DIAGNOSIS — R531 Weakness: Secondary | ICD-10-CM | POA: Diagnosis not present

## 2019-10-30 DIAGNOSIS — M25552 Pain in left hip: Secondary | ICD-10-CM | POA: Diagnosis not present

## 2019-10-30 DIAGNOSIS — M545 Low back pain: Secondary | ICD-10-CM | POA: Diagnosis not present

## 2019-10-30 DIAGNOSIS — R531 Weakness: Secondary | ICD-10-CM | POA: Diagnosis not present

## 2019-10-30 DIAGNOSIS — M25652 Stiffness of left hip, not elsewhere classified: Secondary | ICD-10-CM | POA: Diagnosis not present

## 2019-11-03 DIAGNOSIS — M25652 Stiffness of left hip, not elsewhere classified: Secondary | ICD-10-CM | POA: Diagnosis not present

## 2019-11-03 DIAGNOSIS — R531 Weakness: Secondary | ICD-10-CM | POA: Diagnosis not present

## 2019-11-03 DIAGNOSIS — M545 Low back pain: Secondary | ICD-10-CM | POA: Diagnosis not present

## 2019-11-03 DIAGNOSIS — M25552 Pain in left hip: Secondary | ICD-10-CM | POA: Diagnosis not present

## 2019-11-06 DIAGNOSIS — M25552 Pain in left hip: Secondary | ICD-10-CM | POA: Diagnosis not present

## 2019-11-06 DIAGNOSIS — R531 Weakness: Secondary | ICD-10-CM | POA: Diagnosis not present

## 2019-11-06 DIAGNOSIS — M25652 Stiffness of left hip, not elsewhere classified: Secondary | ICD-10-CM | POA: Diagnosis not present

## 2019-11-06 DIAGNOSIS — M545 Low back pain: Secondary | ICD-10-CM | POA: Diagnosis not present

## 2019-11-12 DIAGNOSIS — M545 Low back pain: Secondary | ICD-10-CM | POA: Diagnosis not present

## 2019-11-12 DIAGNOSIS — M25652 Stiffness of left hip, not elsewhere classified: Secondary | ICD-10-CM | POA: Diagnosis not present

## 2019-11-12 DIAGNOSIS — M25552 Pain in left hip: Secondary | ICD-10-CM | POA: Diagnosis not present

## 2019-11-12 DIAGNOSIS — R531 Weakness: Secondary | ICD-10-CM | POA: Diagnosis not present

## 2019-11-13 DIAGNOSIS — Z793 Long term (current) use of hormonal contraceptives: Secondary | ICD-10-CM | POA: Diagnosis not present

## 2019-11-13 DIAGNOSIS — Z6834 Body mass index (BMI) 34.0-34.9, adult: Secondary | ICD-10-CM | POA: Diagnosis not present

## 2019-11-13 DIAGNOSIS — Z01419 Encounter for gynecological examination (general) (routine) without abnormal findings: Secondary | ICD-10-CM | POA: Diagnosis not present

## 2019-11-13 DIAGNOSIS — Z1389 Encounter for screening for other disorder: Secondary | ICD-10-CM | POA: Diagnosis not present

## 2019-11-14 DIAGNOSIS — R531 Weakness: Secondary | ICD-10-CM | POA: Diagnosis not present

## 2019-11-14 DIAGNOSIS — M25552 Pain in left hip: Secondary | ICD-10-CM | POA: Diagnosis not present

## 2019-11-14 DIAGNOSIS — M545 Low back pain: Secondary | ICD-10-CM | POA: Diagnosis not present

## 2019-11-14 DIAGNOSIS — M25652 Stiffness of left hip, not elsewhere classified: Secondary | ICD-10-CM | POA: Diagnosis not present

## 2019-11-18 DIAGNOSIS — M25652 Stiffness of left hip, not elsewhere classified: Secondary | ICD-10-CM | POA: Diagnosis not present

## 2019-11-18 DIAGNOSIS — M25552 Pain in left hip: Secondary | ICD-10-CM | POA: Diagnosis not present

## 2019-11-18 DIAGNOSIS — R531 Weakness: Secondary | ICD-10-CM | POA: Diagnosis not present

## 2019-11-18 DIAGNOSIS — M545 Low back pain: Secondary | ICD-10-CM | POA: Diagnosis not present

## 2019-11-20 DIAGNOSIS — R531 Weakness: Secondary | ICD-10-CM | POA: Diagnosis not present

## 2019-11-20 DIAGNOSIS — M545 Low back pain: Secondary | ICD-10-CM | POA: Diagnosis not present

## 2019-11-20 DIAGNOSIS — M25652 Stiffness of left hip, not elsewhere classified: Secondary | ICD-10-CM | POA: Diagnosis not present

## 2019-11-20 DIAGNOSIS — M25552 Pain in left hip: Secondary | ICD-10-CM | POA: Diagnosis not present

## 2019-11-25 DIAGNOSIS — M545 Low back pain: Secondary | ICD-10-CM | POA: Diagnosis not present

## 2019-11-25 DIAGNOSIS — R531 Weakness: Secondary | ICD-10-CM | POA: Diagnosis not present

## 2019-11-25 DIAGNOSIS — M25652 Stiffness of left hip, not elsewhere classified: Secondary | ICD-10-CM | POA: Diagnosis not present

## 2019-11-25 DIAGNOSIS — M25552 Pain in left hip: Secondary | ICD-10-CM | POA: Diagnosis not present

## 2019-11-27 DIAGNOSIS — M545 Low back pain: Secondary | ICD-10-CM | POA: Diagnosis not present

## 2019-11-27 DIAGNOSIS — M25552 Pain in left hip: Secondary | ICD-10-CM | POA: Diagnosis not present

## 2019-11-27 DIAGNOSIS — M25652 Stiffness of left hip, not elsewhere classified: Secondary | ICD-10-CM | POA: Diagnosis not present

## 2019-11-27 DIAGNOSIS — R531 Weakness: Secondary | ICD-10-CM | POA: Diagnosis not present

## 2019-12-02 DIAGNOSIS — M25652 Stiffness of left hip, not elsewhere classified: Secondary | ICD-10-CM | POA: Diagnosis not present

## 2019-12-02 DIAGNOSIS — R531 Weakness: Secondary | ICD-10-CM | POA: Diagnosis not present

## 2019-12-02 DIAGNOSIS — M545 Low back pain: Secondary | ICD-10-CM | POA: Diagnosis not present

## 2019-12-02 DIAGNOSIS — M25552 Pain in left hip: Secondary | ICD-10-CM | POA: Diagnosis not present

## 2019-12-04 DIAGNOSIS — R531 Weakness: Secondary | ICD-10-CM | POA: Diagnosis not present

## 2019-12-04 DIAGNOSIS — M545 Low back pain: Secondary | ICD-10-CM | POA: Diagnosis not present

## 2019-12-04 DIAGNOSIS — M25552 Pain in left hip: Secondary | ICD-10-CM | POA: Diagnosis not present

## 2019-12-04 DIAGNOSIS — M25652 Stiffness of left hip, not elsewhere classified: Secondary | ICD-10-CM | POA: Diagnosis not present

## 2019-12-09 DIAGNOSIS — M25652 Stiffness of left hip, not elsewhere classified: Secondary | ICD-10-CM | POA: Diagnosis not present

## 2019-12-09 DIAGNOSIS — M545 Low back pain: Secondary | ICD-10-CM | POA: Diagnosis not present

## 2019-12-09 DIAGNOSIS — R531 Weakness: Secondary | ICD-10-CM | POA: Diagnosis not present

## 2019-12-09 DIAGNOSIS — M25552 Pain in left hip: Secondary | ICD-10-CM | POA: Diagnosis not present

## 2019-12-11 DIAGNOSIS — R531 Weakness: Secondary | ICD-10-CM | POA: Diagnosis not present

## 2019-12-11 DIAGNOSIS — M25552 Pain in left hip: Secondary | ICD-10-CM | POA: Diagnosis not present

## 2019-12-11 DIAGNOSIS — M25652 Stiffness of left hip, not elsewhere classified: Secondary | ICD-10-CM | POA: Diagnosis not present

## 2019-12-11 DIAGNOSIS — M545 Low back pain: Secondary | ICD-10-CM | POA: Diagnosis not present

## 2019-12-15 DIAGNOSIS — M25652 Stiffness of left hip, not elsewhere classified: Secondary | ICD-10-CM | POA: Diagnosis not present

## 2019-12-15 DIAGNOSIS — M545 Low back pain: Secondary | ICD-10-CM | POA: Diagnosis not present

## 2019-12-15 DIAGNOSIS — M25552 Pain in left hip: Secondary | ICD-10-CM | POA: Diagnosis not present

## 2019-12-15 DIAGNOSIS — R531 Weakness: Secondary | ICD-10-CM | POA: Diagnosis not present

## 2019-12-18 DIAGNOSIS — M25552 Pain in left hip: Secondary | ICD-10-CM | POA: Diagnosis not present

## 2019-12-18 DIAGNOSIS — M25652 Stiffness of left hip, not elsewhere classified: Secondary | ICD-10-CM | POA: Diagnosis not present

## 2019-12-18 DIAGNOSIS — M545 Low back pain: Secondary | ICD-10-CM | POA: Diagnosis not present

## 2019-12-18 DIAGNOSIS — R531 Weakness: Secondary | ICD-10-CM | POA: Diagnosis not present

## 2019-12-23 DIAGNOSIS — R531 Weakness: Secondary | ICD-10-CM | POA: Diagnosis not present

## 2019-12-23 DIAGNOSIS — M25552 Pain in left hip: Secondary | ICD-10-CM | POA: Diagnosis not present

## 2019-12-23 DIAGNOSIS — M545 Low back pain: Secondary | ICD-10-CM | POA: Diagnosis not present

## 2019-12-23 DIAGNOSIS — M25652 Stiffness of left hip, not elsewhere classified: Secondary | ICD-10-CM | POA: Diagnosis not present

## 2019-12-25 DIAGNOSIS — M25652 Stiffness of left hip, not elsewhere classified: Secondary | ICD-10-CM | POA: Diagnosis not present

## 2019-12-25 DIAGNOSIS — M545 Low back pain: Secondary | ICD-10-CM | POA: Diagnosis not present

## 2019-12-25 DIAGNOSIS — M25552 Pain in left hip: Secondary | ICD-10-CM | POA: Diagnosis not present

## 2019-12-25 DIAGNOSIS — R531 Weakness: Secondary | ICD-10-CM | POA: Diagnosis not present

## 2020-01-01 DIAGNOSIS — M25652 Stiffness of left hip, not elsewhere classified: Secondary | ICD-10-CM | POA: Diagnosis not present

## 2020-01-01 DIAGNOSIS — R531 Weakness: Secondary | ICD-10-CM | POA: Diagnosis not present

## 2020-01-01 DIAGNOSIS — M545 Low back pain: Secondary | ICD-10-CM | POA: Diagnosis not present

## 2020-01-01 DIAGNOSIS — M25552 Pain in left hip: Secondary | ICD-10-CM | POA: Diagnosis not present

## 2020-01-06 DIAGNOSIS — M545 Low back pain: Secondary | ICD-10-CM | POA: Diagnosis not present

## 2020-01-06 DIAGNOSIS — M25552 Pain in left hip: Secondary | ICD-10-CM | POA: Diagnosis not present

## 2020-01-06 DIAGNOSIS — R531 Weakness: Secondary | ICD-10-CM | POA: Diagnosis not present

## 2020-01-06 DIAGNOSIS — M25652 Stiffness of left hip, not elsewhere classified: Secondary | ICD-10-CM | POA: Diagnosis not present

## 2020-01-08 DIAGNOSIS — R531 Weakness: Secondary | ICD-10-CM | POA: Diagnosis not present

## 2020-01-08 DIAGNOSIS — M545 Low back pain: Secondary | ICD-10-CM | POA: Diagnosis not present

## 2020-01-08 DIAGNOSIS — M25552 Pain in left hip: Secondary | ICD-10-CM | POA: Diagnosis not present

## 2020-01-08 DIAGNOSIS — M25652 Stiffness of left hip, not elsewhere classified: Secondary | ICD-10-CM | POA: Diagnosis not present

## 2020-01-20 DIAGNOSIS — M25552 Pain in left hip: Secondary | ICD-10-CM | POA: Diagnosis not present

## 2020-01-20 DIAGNOSIS — M545 Low back pain: Secondary | ICD-10-CM | POA: Diagnosis not present

## 2020-01-20 DIAGNOSIS — M25652 Stiffness of left hip, not elsewhere classified: Secondary | ICD-10-CM | POA: Diagnosis not present

## 2020-01-20 DIAGNOSIS — R531 Weakness: Secondary | ICD-10-CM | POA: Diagnosis not present

## 2020-01-21 DIAGNOSIS — Z712 Person consulting for explanation of examination or test findings: Secondary | ICD-10-CM | POA: Diagnosis not present

## 2020-01-21 DIAGNOSIS — R946 Abnormal results of thyroid function studies: Secondary | ICD-10-CM | POA: Diagnosis not present

## 2020-01-21 DIAGNOSIS — E039 Hypothyroidism, unspecified: Secondary | ICD-10-CM | POA: Diagnosis not present

## 2020-01-21 DIAGNOSIS — E6609 Other obesity due to excess calories: Secondary | ICD-10-CM | POA: Diagnosis not present

## 2020-01-22 DIAGNOSIS — M25652 Stiffness of left hip, not elsewhere classified: Secondary | ICD-10-CM | POA: Diagnosis not present

## 2020-01-22 DIAGNOSIS — M545 Low back pain: Secondary | ICD-10-CM | POA: Diagnosis not present

## 2020-01-22 DIAGNOSIS — R531 Weakness: Secondary | ICD-10-CM | POA: Diagnosis not present

## 2020-01-22 DIAGNOSIS — M25552 Pain in left hip: Secondary | ICD-10-CM | POA: Diagnosis not present

## 2020-01-27 DIAGNOSIS — Z0001 Encounter for general adult medical examination with abnormal findings: Secondary | ICD-10-CM | POA: Diagnosis not present

## 2020-03-09 DIAGNOSIS — R946 Abnormal results of thyroid function studies: Secondary | ICD-10-CM | POA: Diagnosis not present

## 2020-03-09 DIAGNOSIS — Z0000 Encounter for general adult medical examination without abnormal findings: Secondary | ICD-10-CM | POA: Diagnosis not present

## 2020-03-09 DIAGNOSIS — E039 Hypothyroidism, unspecified: Secondary | ICD-10-CM | POA: Diagnosis not present

## 2020-03-09 DIAGNOSIS — Z6836 Body mass index (BMI) 36.0-36.9, adult: Secondary | ICD-10-CM | POA: Diagnosis not present

## 2020-03-09 DIAGNOSIS — E6609 Other obesity due to excess calories: Secondary | ICD-10-CM | POA: Diagnosis not present

## 2020-06-15 DIAGNOSIS — E6609 Other obesity due to excess calories: Secondary | ICD-10-CM | POA: Diagnosis not present

## 2020-06-15 DIAGNOSIS — E039 Hypothyroidism, unspecified: Secondary | ICD-10-CM | POA: Diagnosis not present

## 2020-06-15 DIAGNOSIS — R946 Abnormal results of thyroid function studies: Secondary | ICD-10-CM | POA: Diagnosis not present

## 2020-06-15 DIAGNOSIS — Z0000 Encounter for general adult medical examination without abnormal findings: Secondary | ICD-10-CM | POA: Diagnosis not present

## 2020-07-09 DIAGNOSIS — M9903 Segmental and somatic dysfunction of lumbar region: Secondary | ICD-10-CM | POA: Diagnosis not present

## 2020-07-09 DIAGNOSIS — M62830 Muscle spasm of back: Secondary | ICD-10-CM | POA: Diagnosis not present

## 2020-07-09 DIAGNOSIS — M546 Pain in thoracic spine: Secondary | ICD-10-CM | POA: Diagnosis not present

## 2020-07-09 DIAGNOSIS — M9902 Segmental and somatic dysfunction of thoracic region: Secondary | ICD-10-CM | POA: Diagnosis not present

## 2020-09-13 DIAGNOSIS — E6609 Other obesity due to excess calories: Secondary | ICD-10-CM | POA: Diagnosis not present

## 2020-09-13 DIAGNOSIS — Z0000 Encounter for general adult medical examination without abnormal findings: Secondary | ICD-10-CM | POA: Diagnosis not present

## 2020-09-13 DIAGNOSIS — E039 Hypothyroidism, unspecified: Secondary | ICD-10-CM | POA: Diagnosis not present

## 2020-09-13 DIAGNOSIS — R946 Abnormal results of thyroid function studies: Secondary | ICD-10-CM | POA: Diagnosis not present

## 2020-11-12 DIAGNOSIS — N939 Abnormal uterine and vaginal bleeding, unspecified: Secondary | ICD-10-CM | POA: Diagnosis not present

## 2020-11-12 DIAGNOSIS — Z6835 Body mass index (BMI) 35.0-35.9, adult: Secondary | ICD-10-CM | POA: Diagnosis not present

## 2020-11-12 DIAGNOSIS — E669 Obesity, unspecified: Secondary | ICD-10-CM | POA: Diagnosis not present

## 2020-11-12 DIAGNOSIS — Z01419 Encounter for gynecological examination (general) (routine) without abnormal findings: Secondary | ICD-10-CM | POA: Diagnosis not present

## 2020-11-12 DIAGNOSIS — N840 Polyp of corpus uteri: Secondary | ICD-10-CM | POA: Diagnosis not present

## 2020-11-12 DIAGNOSIS — Z130 Encounter for screening for diseases of the blood and blood-forming organs and certain disorders involving the immune mechanism: Secondary | ICD-10-CM | POA: Diagnosis not present

## 2020-12-24 DIAGNOSIS — M546 Pain in thoracic spine: Secondary | ICD-10-CM | POA: Diagnosis not present

## 2020-12-24 DIAGNOSIS — M9903 Segmental and somatic dysfunction of lumbar region: Secondary | ICD-10-CM | POA: Diagnosis not present

## 2020-12-24 DIAGNOSIS — M9902 Segmental and somatic dysfunction of thoracic region: Secondary | ICD-10-CM | POA: Diagnosis not present

## 2020-12-24 DIAGNOSIS — M62830 Muscle spasm of back: Secondary | ICD-10-CM | POA: Diagnosis not present

## 2021-01-02 ENCOUNTER — Other Ambulatory Visit: Payer: Self-pay

## 2021-01-02 ENCOUNTER — Ambulatory Visit
Admission: RE | Admit: 2021-01-02 | Discharge: 2021-01-02 | Disposition: A | Discharge status: Home / Self Care | Payer: BC Managed Care – PPO | Source: Ambulatory Visit

## 2021-01-02 VITALS — BP 167/98 | HR 112 | Temp 98.7°F | Resp 16

## 2021-01-02 DIAGNOSIS — N390 Urinary tract infection, site not specified: Secondary | ICD-10-CM | POA: Diagnosis not present

## 2021-01-02 HISTORY — DX: Disorder of thyroid, unspecified: E07.9

## 2021-01-02 LAB — POCT URINALYSIS DIP (MANUAL ENTRY)
Bilirubin, UA: NEGATIVE
Glucose, UA: NEGATIVE mg/dL
Ketones, POC UA: NEGATIVE mg/dL
Nitrite, UA: NEGATIVE
Protein Ur, POC: NEGATIVE mg/dL
Spec Grav, UA: <=1.005 — AB (ref 1.010–1.025)
Urobilinogen, UA: 0.2 E.U./dL
pH, UA: 6 (ref 5.0–8.0)

## 2021-01-02 MED ORDER — CEPHALEXIN 500 MG PO CAPS: 500.0000 mg | ORAL_CAPSULE | Freq: Four times a day (QID) | ORAL | 0 refills | Status: AC

## 2021-01-02 NOTE — ED Provider Notes (Signed)
[facility]    CSN: [id number] Arrival date & time: 01/02/21  1237      History   Chief Complaint No chief complaint on file.   HPI Julia Cross is a 35 y.o. female.   The history is provided by the patient. No language interpreter was used.  Dysuria Pain quality:  Unable to specify Pain severity:  Moderate Onset quality:  Gradual Duration:  6 days Timing:  Constant Progression:  Worsening Chronicity:  New Relieved by:  Nothing Worsened by:  Nothing  Past Medical History:  Diagnosis Date   Thyroid disease     There are no problems to display for this patient.   History reviewed. No pertinent surgical history.  OB History   No obstetric history on file.      Home Medications    Prior to Admission medications   Medication Sig Start Date End Date Taking? Authorizing Provider  cephALEXin (KEFLEX) 500 MG capsule Take 1 capsule (500 mg total) by mouth 4 (four) times daily for 10 days. 01/02/21 01/12/21 Yes Elson Areas, PA-C  levothyroxine (SYNTHROID) 137 MCG tablet Take 137 mcg by mouth daily before breakfast.   Yes [provider]    Family History Family History  Problem Relation Age of Onset   Hypertension Mother    Diabetes Father     Social History Social History   Tobacco Use   Smoking status: Never   Smokeless tobacco: Never  Vaping Use   Vaping Use: Never used  Substance Use Topics   Alcohol use: Never   Drug use: Never     Allergies   Sulfa antibiotics   Review of Systems Review of Systems  Genitourinary:  Positive for dysuria.  All other systems reviewed and are negative.   Physical Exam Triage Vital Signs ED Triage Vitals  Enc Vitals Group     BP 01/02/21 1248 (!) 167/98     Pulse Rate 01/02/21 1248 (!) 112     Resp 01/02/21 1248 16     Temp 01/02/21 1248 98.7 F (37.1 C)     Temp Source 01/02/21 1248 Tympanic     SpO2 01/02/21 1248 99 %     Weight --      Height --      Head Circumference  --      Peak Flow --      Pain Score 01/02/21 1249 5     Pain Loc --      Pain Edu? --      Excl. in GC? --    No data found.  Updated Vital Signs BP (!) 167/98 (BP Location: Right Arm)   Pulse (!) 112   Temp 98.7 F (37.1 C) (Tympanic)   Resp 16   LMP 12/12/2020   SpO2 99%   Visual Acuity Right Eye Distance:   Left Eye Distance:   Bilateral Distance:    Right Eye Near:   Left Eye Near:    Bilateral Near:     Physical Exam Vitals reviewed.  HENT:     Nose: Congestion present.  Cardiovascular:     Rate and Rhythm: Normal rate.  Pulmonary:     Effort: Pulmonary effort is normal.  Abdominal:     General: Abdomen is flat.  Musculoskeletal:        General: Normal range of motion.  Skin:    General: Skin is warm.  Neurological:     General: No focal deficit present.  Mental Status: She is alert.  Psychiatric:        Mood and Affect: Mood normal.     UC Treatments / Results  Labs (all labs ordered are listed, but only abnormal results are displayed) Labs Reviewed  POCT URINALYSIS DIP (MANUAL ENTRY) - Abnormal; Notable for the following components:      Result Value   Color, UA light yellow (*)    Spec Grav, UA <=1.005 (*)    Blood, UA trace-intact (*)    Leukocytes, UA Small (1+) (*)    All other components within normal limits    EKG   Radiology No results found.  Procedures Procedures (including critical care time)  Medications Ordered in UC Medications - No data to display  Initial Impression / Assessment and Plan / UC Course  I have reviewed the triage vital signs and the nursing notes.  Pertinent labs & imaging results that were available during my care of the patient were reviewed by me and considered in my medical decision making (see chart for details).     MDM:  ua shows leukocytes Final Clinical Impressions(s) / UC Diagnoses   Final diagnoses:  Urinary tract infection without hematuria, site unspecified     Discharge  Instructions      Return if any problems.    ED Prescriptions     Medication Sig Dispense Auth. Provider   cephALEXin (KEFLEX) 500 MG capsule Take 1 capsule (500 mg total) by mouth 4 (four) times daily for 10 days. 40 capsule Elson Areas, [place]      PDMP not reviewed this encounter.   Elson Areas, [place] 01/02/21 1321

## 2021-01-02 NOTE — ED Triage Notes (Signed)
Sinus drainage on Tuesday.  Back pain and burning on urination started on Tuesday.

## 2021-01-02 NOTE — Discharge Instructions (Addendum)
Return if any problems.

## 2021-01-21 DIAGNOSIS — E039 Hypothyroidism, unspecified: Secondary | ICD-10-CM | POA: Diagnosis not present

## 2021-01-21 DIAGNOSIS — Z0000 Encounter for general adult medical examination without abnormal findings: Secondary | ICD-10-CM | POA: Diagnosis not present

## 2021-01-24 DIAGNOSIS — Z0001 Encounter for general adult medical examination with abnormal findings: Secondary | ICD-10-CM | POA: Diagnosis not present

## 2021-02-21 DIAGNOSIS — N939 Abnormal uterine and vaginal bleeding, unspecified: Secondary | ICD-10-CM | POA: Diagnosis not present

## 2021-02-21 DIAGNOSIS — Z3040 Encounter for surveillance of contraceptives, unspecified: Secondary | ICD-10-CM | POA: Diagnosis not present

## 2021-03-08 DIAGNOSIS — H109 Unspecified conjunctivitis: Secondary | ICD-10-CM | POA: Diagnosis not present

## 2021-03-08 DIAGNOSIS — J019 Acute sinusitis, unspecified: Secondary | ICD-10-CM | POA: Diagnosis not present

## 2021-06-23 DIAGNOSIS — N840 Polyp of corpus uteri: Secondary | ICD-10-CM | POA: Diagnosis not present

## 2021-06-23 DIAGNOSIS — N926 Irregular menstruation, unspecified: Secondary | ICD-10-CM | POA: Diagnosis not present

## 2021-07-28 DIAGNOSIS — E039 Hypothyroidism, unspecified: Secondary | ICD-10-CM | POA: Diagnosis not present

## 2021-07-28 DIAGNOSIS — R7303 Prediabetes: Secondary | ICD-10-CM | POA: Diagnosis not present

## 2021-08-04 DIAGNOSIS — Z6833 Body mass index (BMI) 33.0-33.9, adult: Secondary | ICD-10-CM | POA: Diagnosis not present

## 2021-08-04 DIAGNOSIS — E669 Obesity, unspecified: Secondary | ICD-10-CM | POA: Diagnosis not present

## 2021-08-04 DIAGNOSIS — M5416 Radiculopathy, lumbar region: Secondary | ICD-10-CM | POA: Diagnosis not present

## 2021-08-04 DIAGNOSIS — M25551 Pain in right hip: Secondary | ICD-10-CM | POA: Diagnosis not present

## 2021-08-08 DIAGNOSIS — N840 Polyp of corpus uteri: Secondary | ICD-10-CM | POA: Diagnosis not present

## 2021-08-08 DIAGNOSIS — N939 Abnormal uterine and vaginal bleeding, unspecified: Secondary | ICD-10-CM | POA: Diagnosis not present

## 2021-08-08 DIAGNOSIS — N72 Inflammatory disease of cervix uteri: Secondary | ICD-10-CM | POA: Diagnosis not present

## 2021-08-08 DIAGNOSIS — Z3202 Encounter for pregnancy test, result negative: Secondary | ICD-10-CM | POA: Diagnosis not present

## 2021-11-17 DIAGNOSIS — Z130 Encounter for screening for diseases of the blood and blood-forming organs and certain disorders involving the immune mechanism: Secondary | ICD-10-CM | POA: Diagnosis not present

## 2021-11-17 DIAGNOSIS — Z1389 Encounter for screening for other disorder: Secondary | ICD-10-CM | POA: Diagnosis not present

## 2021-11-17 DIAGNOSIS — Z6834 Body mass index (BMI) 34.0-34.9, adult: Secondary | ICD-10-CM | POA: Diagnosis not present

## 2021-11-17 DIAGNOSIS — Z01419 Encounter for gynecological examination (general) (routine) without abnormal findings: Secondary | ICD-10-CM | POA: Diagnosis not present

## 2022-01-04 DIAGNOSIS — M9901 Segmental and somatic dysfunction of cervical region: Secondary | ICD-10-CM | POA: Diagnosis not present

## 2022-01-04 DIAGNOSIS — M9902 Segmental and somatic dysfunction of thoracic region: Secondary | ICD-10-CM | POA: Diagnosis not present

## 2022-01-04 DIAGNOSIS — M542 Cervicalgia: Secondary | ICD-10-CM | POA: Diagnosis not present

## 2022-01-04 DIAGNOSIS — M546 Pain in thoracic spine: Secondary | ICD-10-CM | POA: Diagnosis not present

## 2022-01-23 DIAGNOSIS — E669 Obesity, unspecified: Secondary | ICD-10-CM | POA: Diagnosis not present

## 2022-01-23 DIAGNOSIS — R7303 Prediabetes: Secondary | ICD-10-CM | POA: Diagnosis not present

## 2022-01-23 DIAGNOSIS — E039 Hypothyroidism, unspecified: Secondary | ICD-10-CM | POA: Diagnosis not present

## 2022-02-06 DIAGNOSIS — Z0001 Encounter for general adult medical examination with abnormal findings: Secondary | ICD-10-CM | POA: Diagnosis not present

## 2022-03-06 DIAGNOSIS — N921 Excessive and frequent menstruation with irregular cycle: Secondary | ICD-10-CM | POA: Diagnosis not present

## 2022-03-07 LAB — LAB REPORT - SCANNED
Free T4: 1.81 ng/dL
TSH: 1.42 (ref 0.41–5.90)

## 2022-08-03 DIAGNOSIS — E669 Obesity, unspecified: Secondary | ICD-10-CM | POA: Diagnosis not present

## 2022-08-03 DIAGNOSIS — R7303 Prediabetes: Secondary | ICD-10-CM | POA: Diagnosis not present

## 2022-08-03 DIAGNOSIS — E039 Hypothyroidism, unspecified: Secondary | ICD-10-CM | POA: Diagnosis not present

## 2022-08-09 DIAGNOSIS — M25551 Pain in right hip: Secondary | ICD-10-CM | POA: Diagnosis not present

## 2022-08-09 DIAGNOSIS — M5416 Radiculopathy, lumbar region: Secondary | ICD-10-CM | POA: Diagnosis not present

## 2022-08-09 DIAGNOSIS — E039 Hypothyroidism, unspecified: Secondary | ICD-10-CM | POA: Diagnosis not present

## 2022-08-09 DIAGNOSIS — E669 Obesity, unspecified: Secondary | ICD-10-CM | POA: Diagnosis not present

## 2022-11-30 DIAGNOSIS — Z01419 Encounter for gynecological examination (general) (routine) without abnormal findings: Secondary | ICD-10-CM | POA: Diagnosis not present

## 2022-11-30 DIAGNOSIS — Z124 Encounter for screening for malignant neoplasm of cervix: Secondary | ICD-10-CM | POA: Diagnosis not present

## 2023-01-26 DIAGNOSIS — R7303 Prediabetes: Secondary | ICD-10-CM | POA: Diagnosis not present

## 2023-01-26 DIAGNOSIS — E039 Hypothyroidism, unspecified: Secondary | ICD-10-CM | POA: Diagnosis not present

## 2023-01-26 DIAGNOSIS — E669 Obesity, unspecified: Secondary | ICD-10-CM | POA: Diagnosis not present

## 2023-02-08 DIAGNOSIS — E669 Obesity, unspecified: Secondary | ICD-10-CM | POA: Diagnosis not present

## 2023-02-08 DIAGNOSIS — Z0000 Encounter for general adult medical examination without abnormal findings: Secondary | ICD-10-CM | POA: Diagnosis not present

## 2023-02-08 DIAGNOSIS — Z79899 Other long term (current) drug therapy: Secondary | ICD-10-CM | POA: Diagnosis not present

## 2023-02-08 DIAGNOSIS — Z0001 Encounter for general adult medical examination with abnormal findings: Secondary | ICD-10-CM | POA: Diagnosis not present

## 2023-02-08 DIAGNOSIS — R809 Proteinuria, unspecified: Secondary | ICD-10-CM | POA: Diagnosis not present

## 2023-02-08 DIAGNOSIS — M5416 Radiculopathy, lumbar region: Secondary | ICD-10-CM | POA: Diagnosis not present

## 2023-02-08 DIAGNOSIS — E039 Hypothyroidism, unspecified: Secondary | ICD-10-CM | POA: Diagnosis not present

## 2023-02-08 DIAGNOSIS — N939 Abnormal uterine and vaginal bleeding, unspecified: Secondary | ICD-10-CM | POA: Diagnosis not present

## 2023-02-08 DIAGNOSIS — M25551 Pain in right hip: Secondary | ICD-10-CM | POA: Diagnosis not present

## 2023-03-15 DIAGNOSIS — E669 Obesity, unspecified: Secondary | ICD-10-CM | POA: Diagnosis not present

## 2023-03-15 DIAGNOSIS — N840 Polyp of corpus uteri: Secondary | ICD-10-CM | POA: Diagnosis not present

## 2023-03-15 DIAGNOSIS — E039 Hypothyroidism, unspecified: Secondary | ICD-10-CM | POA: Diagnosis not present

## 2023-03-15 DIAGNOSIS — R7303 Prediabetes: Secondary | ICD-10-CM | POA: Diagnosis not present

## 2023-05-04 DIAGNOSIS — R7303 Prediabetes: Secondary | ICD-10-CM | POA: Diagnosis not present

## 2023-05-04 DIAGNOSIS — E039 Hypothyroidism, unspecified: Secondary | ICD-10-CM | POA: Diagnosis not present

## 2023-05-04 LAB — TSH: TSH: 0.36 — AB (ref 0.41–5.90)

## 2024-01-17 LAB — TSH: TSH: 0.52 (ref 0.41–5.90)

## 2024-03-04 NOTE — Patient Instructions (Signed)

## 2024-03-06 ENCOUNTER — Encounter: Payer: Self-pay | Admitting: Nurse Practitioner

## 2024-03-06 ENCOUNTER — Ambulatory Visit (INDEPENDENT_AMBULATORY_CARE_PROVIDER_SITE_OTHER): Admitting: Nurse Practitioner

## 2024-03-06 VITALS — BP 124/72 | HR 96 | Ht 66.0 in | Wt 217.2 lb

## 2024-03-06 DIAGNOSIS — E039 Hypothyroidism, unspecified: Secondary | ICD-10-CM | POA: Diagnosis not present

## 2024-03-06 NOTE — Progress Notes (Signed)
 Endocrinology Consult Note                                         03/06/2024, 10:17 AM  Subjective:   Subjective    Julia Cross is a 38 y.o.-year-old female patient being seen in consultation for hypothyroidism referred by her OBGYN.  Her PCP is Shona Norleen PEDLAR, MD.   Past Medical History:  Diagnosis Date   Thyroid  disease     History reviewed. No pertinent surgical history.  Social History   Socioeconomic History   Marital status: Single    Spouse name: Not on file   Number of children: Not on file   Years of education: Not on file   Highest education level: Not on file  Occupational History   Not on file  Tobacco Use   Smoking status: Never   Smokeless tobacco: Never  Vaping Use   Vaping status: Never Used  Substance and Sexual Activity   Alcohol use: Never   Drug use: Never   Sexual activity: Not on file  Other Topics Concern   Not on file  Social History Narrative   Not on file   Social Drivers of Health   Financial Resource Strain: Not on file  Food Insecurity: Not on file  Transportation Needs: Not on file  Physical Activity: Not on file  Stress: Not on file  Social Connections: Not on file    Family History  Problem Relation Age of Onset   Hypertension Mother    Diabetes Father     Outpatient Encounter Medications as of 03/06/2024  Medication Sig   FALMINA 0.1-20 MG-MCG tablet Take 1 tablet by mouth daily.   SYNTHROID 150 MCG tablet Take 150 mcg by mouth daily before breakfast.   [DISCONTINUED] levothyroxine (SYNTHROID) 137 MCG tablet Take 137 mcg by mouth daily before breakfast. (Patient not taking: Reported on 03/06/2024)   No facility-administered encounter medications on file as of 03/06/2024.    ALLERGIES: Allergies  Allergen Reactions   Sulfa Antibiotics    VACCINATION STATUS:  There is no immunization history on file for this patient.   HPI   Julia Cross  is a patient with the above medical history. she was diagnosed with hypothyroidism at approximate age of 9 years, which required subsequent initiation of thyroid  hormone replacement therapy. she was given various doses of Levothyroxine/Synthroid over the years, currently on 150 micrograms. she reports compliance to this medication:  Taking it daily on empty stomach with water, separated by >30 minutes before breakfast and other medications, and by at least 4 hours from calcium, iron, PPIs, multivitamins .  I reviewed patient's thyroid  tests:  Lab Results  Component Value Date   TSH 0.52 01/17/2024   TSH 0.36 (A) 05/04/2023   TSH 1.42 03/07/2022   FREET4 1.81 03/07/2022     Pt describes: - difficulty losing weight - fatigue - anxious/on edge - insomnia - irregular menstrual bleeding - intermittent palpitations  Pt denies feeling nodules in neck,  hoarseness, dysphagia/odynophagia, SOB with lying down.  she does have family history of thyroid  disorders in her grandfather and maternal grandmother.  No family history of thyroid  cancer.  No history of radiation therapy to head or neck.  No recent use of iodine supplements.  Denies use of Biotin containing supplements.    ROS:  Constitutional: difficulty losing weight, + fatigue, no subjective hyperthermia, no subjective hypothermia Eyes: no blurry vision, no xerophthalmia ENT: no sore throat, no nodules palpated in throat, no dysphagia/odynophagia, no hoarseness Cardiovascular: no chest pain, no SOB, + intermittent palpitations, no leg swelling Respiratory: no cough, no SOB Gastrointestinal: no nausea/vomiting/diarrhea Musculoskeletal: no muscle/joint aches Skin: no rashes Neurological: no tremors, no numbness, + tingling to fingers-intermittent, no dizziness Psychiatric: no depression, + anxiety, + insomnia   Objective:   Objective     BP 124/72 (BP Location: Left Arm, Patient Position: Sitting, Cuff Size: Large)    Pulse 96   Ht 5' 6 (1.676 m)   Wt 217 lb 3.2 oz (98.5 kg)   BMI 35.06 kg/m  Wt Readings from Last 3 Encounters:  03/06/24 217 lb 3.2 oz (98.5 kg)    BP Readings from Last 3 Encounters:  03/06/24 124/72  01/02/21 (!) 167/98     Constitutional:  Body mass index is 35.06 kg/m., not in acute distress, normal state of mind Eyes: PERRLA, EOMI, no exophthalmos ENT: moist mucous membranes, no thyromegaly, no cervical lymphadenopathy Cardiovascular: normal precordial activity, RRR- borderline tachycardic at rest, no murmur/rubs/gallops Respiratory:  adequate breathing efforts, no gross chest deformity, Clear to auscultation bilaterally Musculoskeletal: no gross deformities, strength intact in all four extremities Skin: moist, warm, no rashes Neurological: + tremor with outstretched hands, deep tendon reflexes normal in BLE.   CMP ( most recent) CMP  No results found for: NA, K, CL, CO2, GLUCOSE, BUN, CREATININE, CALCIUM, PROT, ALBUMIN, AST, ALT, ALKPHOS, BILITOT, GFR, EGFR, GFRNONAA   Diabetic Labs (most recent): No results found for: HGBA1C, MICROALBUR   Lipid Panel ( most recent) Lipid Panel  No results found for: CHOL, TRIG, HDL, CHOLHDL, VLDL, LDLCALC, LDLDIRECT, LABVLDL     Thyroid  Ultrasound from 07/31/2018 CLINICAL DATA:  Other.  Abnormal thyroid  function tests.   EXAM: THYROID  ULTRASOUND   TECHNIQUE: Ultrasound examination of the thyroid  gland and adjacent soft tissues was performed.   COMPARISON:  None.   FINDINGS: Parenchymal Echotexture: Moderately heterogenous - mild lobularity of the border of the thyroid  gland. No definitive glandular hyperemia.   Isthmus: Normal in size measures 0.3 cm in diameter   Right lobe: Normal in size measuring 4.3 x 2.2 x 1.9 cm   Left lobe: Meters normal in size measuring 4.5 x 1.9 x 1.9 cm   _________________________________________________________   Estimated total  number of nodules >/= 1 cm: 0   Number of spongiform nodules >/=  2 cm not described below (TR1): 0   Number of mixed cystic and solid nodules >/= 1.5 cm not described below (TR2): 0   _________________________________________________________   No discrete nodules are seen within the thyroid  gland.   IMPRESSION: Moderately heterogeneous though normal sized thyroid  gland without discrete nodule or mass. Findings are nonspecific though could be seen in the setting of a thyroiditis. Clinical correlation is advised.     Electronically Signed   By: Norleen Roulette M.D.   On: 07/31/2018 12:10   Assessment & Plan:   ASSESSMENT / PLAN:  1. Hypothyroidism- suspect r/t Hashimotos due to positive family history.  Patient with long-standing hypothyroidism,  on levothyroxine therapy. On physical exam, patient does not have gross goiter, thyroid  nodules, or neck compression symptoms. She is advised to continue Synthroid 150 mcg po daily before breakfast.  Will call with thyroid  labs and next steps.  Suspect she still may be over-replaced and need dosage reduction.  Will check antibodies to confirm etiology.  - We discussed about correct intake of levothyroxine, at fasting, with water, separated by at least 30 minutes from breakfast, and separated by more than 4 hours from calcium, iron, multivitamins, acid reflux medications (PPIs). -Patient is made aware of the fact that thyroid  hormone replacement is needed for life, dose to be adjusted by periodic monitoring of thyroid  function tests.  -Due to absence of clinical goiter, no need for thyroid  ultrasound.  Previous ultrasound reviewed showing no nodules.  - Time spent with the patient: 45 minutes,which was spent in obtaining information about her symptoms, reviewing her previous labs, evaluations, and treatments, counseling her about her hypothyroidism, and developing a plan to confirm the diagnosis and long term treatment as necessary. Please  refer to Patient Self Inventory in the Media tab for reviewed elements of pertinent patient history.  Julia Cross participated in the discussions, expressed understanding, and voiced agreement with the above plans.  All questions were answered to her satisfaction. she is encouraged to contact clinic should she have any questions or concerns prior to her return visit.   FOLLOW UP PLAN:  Return labs today, will call with results and next steps.SABRA Benton Rio, FNP-BC [facility] [address] [place], [place] [address] Phone: [phone] Fax: [phone]  03/06/2024, 10:17 AM

## 2024-03-07 LAB — TSH: TSH: 0.656 u[IU]/mL (ref 0.450–4.500)

## 2024-03-07 LAB — T4, FREE: Free T4: 1.84 ng/dL — ABNORMAL HIGH (ref 0.82–1.77)

## 2024-03-07 LAB — THYROGLOBULIN ANTIBODY: Thyroglobulin Antibody: <1 [IU]/mL (ref 0.0–0.9)

## 2024-03-07 LAB — THYROID PEROXIDASE ANTIBODY: Thyroperoxidase Ab SerPl-aCnc: 142 [IU]/mL — ABNORMAL HIGH (ref 0–34)

## 2024-03-10 ENCOUNTER — Other Ambulatory Visit: Payer: Self-pay | Admitting: Nurse Practitioner

## 2024-03-10 ENCOUNTER — Ambulatory Visit: Payer: Self-pay | Admitting: Nurse Practitioner

## 2024-03-10 DIAGNOSIS — E039 Hypothyroidism, unspecified: Secondary | ICD-10-CM

## 2024-03-10 DIAGNOSIS — E063 Autoimmune thyroiditis: Secondary | ICD-10-CM

## 2024-03-10 MED ORDER — SYNTHROID 137 MCG PO TABS: 137.0000 ug | ORAL_TABLET | Freq: Every day | ORAL | 1 refills | Status: DC

## 2024-03-10 NOTE — Telephone Encounter (Signed)
 See patient response.

## 2024-03-10 NOTE — Progress Notes (Signed)
 FYI I sent mychart message going over thyroid  lab results

## 2024-03-10 NOTE — Progress Notes (Signed)
 Noted

## 2024-03-17 LAB — LAB REPORT - SCANNED
A1c: 5.5
Albumin, Urine POC: 11.7
Albumin/Creatinine Ratio, Urine, POC: 51
Creatinine, POC: 22.9 mg/dL
EGFR: 91
Free T4: 1.8 ng/dL
TSH: 1.13

## 2024-05-14 ENCOUNTER — Ambulatory Visit: Payer: Self-pay | Admitting: Nurse Practitioner

## 2024-05-27 ENCOUNTER — Encounter: Payer: Self-pay | Admitting: Nurse Practitioner

## 2024-05-27 DIAGNOSIS — E063 Autoimmune thyroiditis: Secondary | ICD-10-CM

## 2024-05-31 LAB — T4, FREE: Free T4: 1.88 ng/dL — ABNORMAL HIGH (ref 0.82–1.77)

## 2024-05-31 LAB — TSH: TSH: 1.77 u[IU]/mL (ref 0.450–4.500)

## 2024-06-09 ENCOUNTER — Ambulatory Visit: Admitting: Nurse Practitioner

## 2024-06-09 DIAGNOSIS — E063 Autoimmune thyroiditis: Secondary | ICD-10-CM

## 2024-06-09 MED ORDER — LEVOTHYROXINE SODIUM 125 MCG PO TABS: 125.0000 ug | ORAL_TABLET | Freq: Every day | ORAL | 1 refills | Status: AC

## 2024-06-09 NOTE — Telephone Encounter (Signed)
 See patient message about canceling her appt today

## 2024-09-11 ENCOUNTER — Ambulatory Visit: Admitting: Nurse Practitioner
